# Patient Record
Sex: Male | Born: 1990 | Race: Black or African American | Hispanic: No | Marital: Single | Smoking: Current every day smoker
Health system: Southern US, Community
[De-identification: ages and names within clinical notes are randomized; demographics above are authoritative.]

## PROBLEM LIST (undated history)

## (undated) HISTORY — PX: ARTHROSCOPIC REPAIR ACL: SUR80

---
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
## 2010-02-16 ENCOUNTER — Emergency Department (HOSPITAL_COMMUNITY): Admission: EM | Admit: 2010-02-16 | Discharge: 2010-02-16 | Payer: Self-pay | Admitting: Emergency Medicine

## 2010-05-13 ENCOUNTER — Emergency Department (HOSPITAL_COMMUNITY): Admission: EM | Admit: 2010-05-13 | Discharge: 2010-05-13 | Payer: Self-pay | Admitting: Emergency Medicine

## 2012-06-22 ENCOUNTER — Emergency Department (HOSPITAL_COMMUNITY): Payer: BC Managed Care – PPO

## 2012-06-22 ENCOUNTER — Emergency Department (HOSPITAL_COMMUNITY)
Admission: EM | Admit: 2012-06-22 | Discharge: 2012-06-22 | Disposition: A | Discharge status: Home / Self Care | Payer: BC Managed Care – PPO | Attending: Emergency Medicine | Admitting: Emergency Medicine

## 2012-06-22 ENCOUNTER — Encounter (HOSPITAL_COMMUNITY): Payer: Self-pay

## 2012-06-22 DIAGNOSIS — X500XXA Overexertion from strenuous movement or load, initial encounter: Secondary | ICD-10-CM | POA: Insufficient documentation

## 2012-06-22 DIAGNOSIS — M25461 Effusion, right knee: Secondary | ICD-10-CM

## 2012-06-22 DIAGNOSIS — M25469 Effusion, unspecified knee: Secondary | ICD-10-CM | POA: Insufficient documentation

## 2012-06-22 DIAGNOSIS — M25569 Pain in unspecified knee: Secondary | ICD-10-CM | POA: Insufficient documentation

## 2012-06-22 DIAGNOSIS — Y9367 Activity, basketball: Secondary | ICD-10-CM | POA: Insufficient documentation

## 2012-06-22 MED ORDER — HYDROCODONE-ACETAMINOPHEN 5-325 MG PO TABS
1.0000 | ORAL_TABLET | Freq: Once | ORAL | Status: AC
  Administered 2012-06-22: 1 via ORAL
  Filled 2012-06-22: qty 1

## 2012-06-22 MED ORDER — IBUPROFEN 800 MG PO TABS
800.0000 mg | ORAL_TABLET | Freq: Once | ORAL | Status: AC
  Administered 2012-06-22: 800 mg via ORAL
  Filled 2012-06-22: qty 1

## 2012-06-22 MED ORDER — HYDROCODONE-ACETAMINOPHEN 5-325 MG PO TABS: 1.0000 | ORAL_TABLET | Freq: Four times a day (QID) | ORAL | Status: DC | PRN

## 2012-06-22 NOTE — ED Notes (Signed)
Complain of pain in right knee from injury

## 2012-06-22 NOTE — ED Provider Notes (Signed)
Medical screening examination/treatment/procedure(s) were performed by non-physician practitioner and as supervising physician I was immediately available for consultation/collaboration.   Carleene Cooper III, MD 06/22/12 [phone]

## 2012-06-22 NOTE — ED Notes (Signed)
Pt c/o pain and swelling to his right knee since yesterday. States that he felt something pop while playing basketball. Moderate amount swelling to left knee. Able to bend knee but c/o pain with movement. Pt has strong pedal pulses right foot. Pt alert and oriented x 3. Skin warm and dry. Color pink.

## 2012-06-22 NOTE — ED Provider Notes (Signed)
History     CSN: [id number]  Arrival date & time 06/22/12  1029   First MD Initiated Contact with Patient 06/22/12 1047      Chief Complaint  Patient presents with  . Knee Injury    (Consider location/radiation/quality/duration/timing/severity/associated sxs/prior treatment) HPI Comments: Playing basketball yest and felt knee "pop".  Has since developed pain in swelling in R knee.  No h/o knee problem.  Taking no meds.  No ortho MD.  The history is provided by the patient. No language interpreter was used.    History reviewed. No pertinent past medical history.  History reviewed. No pertinent past surgical history.  No family history on file.  History  Substance Use Topics  . Smoking status: Not on file  . Smokeless tobacco: Not on file  . Alcohol Use: No      Review of Systems  Constitutional: Negative for fever and chills.  Musculoskeletal: Positive for joint swelling.       Knee injury  All other systems reviewed and are negative.    Allergies  Codeine  Home Medications   Current Outpatient Rx  Name Route Sig Dispense Refill  . IBUPROFEN 200 MG PO TABS Oral Take 400 mg by mouth every 6 (six) hours as needed. For pain    . HYDROCODONE-ACETAMINOPHEN 5-325 MG PO TABS Oral Take 1 tablet by mouth every 6 (six) hours as needed for pain. 20 tablet 0    BP 121/67  Pulse 68  Temp 98.3 F (36.8 C) (Oral)  Resp 18  Ht 6\' 3"  (1.905 m)  Wt 183 lb (83.008 kg)  BMI 22.87 kg/m2  SpO2 100%  Physical Exam  Nursing note and vitals reviewed. Constitutional: He is oriented to person, place, and time. He appears well-developed and well-nourished.  HENT:  Head: Normocephalic and atraumatic.  Eyes: EOM are normal.  Neck: Normal range of motion.  Cardiovascular: Normal rate, regular rhythm, normal heart sounds and intact distal pulses.   Pulmonary/Chest: Effort normal and breath sounds normal. No respiratory distress.  Abdominal: Soft. He exhibits no distension.  There is no tenderness.  Musculoskeletal: He exhibits tenderness.       Right knee: He exhibits decreased range of motion and effusion. He exhibits no ecchymosis, no deformity, no laceration and no erythema. tenderness found.       Pain with valgus and varus stress.  Neurological: He is alert and oriented to person, place, and time.  Skin: Skin is warm and dry.  Psychiatric: He has a normal mood and affect. Judgment normal.    ED Course  Procedures (including critical care time)  Labs Reviewed - No data to display Dg Knee Complete 4 Views Right  06/22/2012  *RADIOLOGY REPORT*  Clinical Data: Basketball injury.  Medial knee pain.  RIGHT KNEE - COMPLETE 4+ VIEW  Comparison: None.  Findings: Large effusion.  No fracture.  Anatomic alignment.  Joint spaces are preserved.  IMPRESSION: Large knee effusion.  Original Report Authenticated By: Andreas Newport, M.D.     1. Knee effusion, right       MDM  No fx's.  Knee immobilizer, crutches, ice, ibuprofen 800 mg TID and rx-hydrocodone, 20 F/u with dr. Romeo Apple.        Evalina Field, PA 06/22/12 1140

## 2012-06-25 ENCOUNTER — Encounter (HOSPITAL_COMMUNITY): Payer: Self-pay | Admitting: Emergency Medicine

## 2012-06-25 ENCOUNTER — Emergency Department (HOSPITAL_COMMUNITY)
Admission: EM | Admit: 2012-06-25 | Discharge: 2012-06-25 | Disposition: A | Discharge status: Home / Self Care | Payer: BC Managed Care – PPO | Attending: Emergency Medicine | Admitting: Emergency Medicine

## 2012-06-25 ENCOUNTER — Emergency Department (HOSPITAL_COMMUNITY): Payer: BC Managed Care – PPO

## 2012-06-25 DIAGNOSIS — M25469 Effusion, unspecified knee: Secondary | ICD-10-CM | POA: Insufficient documentation

## 2012-06-25 DIAGNOSIS — Y9367 Activity, basketball: Secondary | ICD-10-CM | POA: Insufficient documentation

## 2012-06-25 DIAGNOSIS — S8990XA Unspecified injury of unspecified lower leg, initial encounter: Secondary | ICD-10-CM | POA: Insufficient documentation

## 2012-06-25 DIAGNOSIS — R296 Repeated falls: Secondary | ICD-10-CM | POA: Insufficient documentation

## 2012-06-25 DIAGNOSIS — Y9239 Other specified sports and athletic area as the place of occurrence of the external cause: Secondary | ICD-10-CM | POA: Insufficient documentation

## 2012-06-25 DIAGNOSIS — S83509A Sprain of unspecified cruciate ligament of unspecified knee, initial encounter: Secondary | ICD-10-CM | POA: Insufficient documentation

## 2012-06-25 DIAGNOSIS — R29898 Other symptoms and signs involving the musculoskeletal system: Secondary | ICD-10-CM | POA: Insufficient documentation

## 2012-06-25 DIAGNOSIS — S83519A Sprain of anterior cruciate ligament of unspecified knee, initial encounter: Secondary | ICD-10-CM

## 2012-06-25 MED ORDER — NAPROXEN 500 MG PO TABS: 500.0000 mg | ORAL_TABLET | Freq: Two times a day (BID) | ORAL | Status: AC

## 2012-06-25 NOTE — ED Notes (Signed)
States he was in the shower this morning and felt something pop in his right knee.  States he injured his right knee initially about 4 days ago and was seen here in the er.

## 2012-06-25 NOTE — ED Provider Notes (Addendum)
History     CSN: [id number]  Arrival date & time 06/25/12  7829   First MD Initiated Contact with Patient 06/25/12 [phone]      Chief Complaint  Patient presents with  . Knee Injury    (Consider location/radiation/quality/duration/timing/severity/associated sxs/prior treatment) The history is provided by the patient.  patient seen in the emergency department on July 7 following a knee injury that occurred the day before. Patient had x-rays on the seventh which showed a large effusion but no bony abnormalities. Patient continues to have problems with the right knee. Today while getting in the shower and another popping sensation the knee feels like it was going to give out and had increased pain. Patient was treated with knee immobilizer and crutches and referral to orthopedics as well as hydrocodone. Patient did not make appointment orthopedic she wasn't clear in his mind that it was necessary. Patient still has a significant swelling to the right knee no prior injury to the knee. Pain at times is 10 out of 10 currently now is about a 5/10. No sensory changes to his foot on that side no other injuries. Patient is not taking anti-inflammatories. Pain is mostly on the medial side of the right knee does not radiate. Pain is described as sharp and then a period  Original injury occurred while playing basketball the knee just gave out the patient went down.  History reviewed. No pertinent past medical history.  History reviewed. No pertinent past surgical history.  No family history on file.  History  Substance Use Topics  . Smoking status: Not on file  . Smokeless tobacco: Not on file  . Alcohol Use: No      Review of Systems  Constitutional: Negative for fever and chills.  HENT: Negative for congestion and neck pain.   Eyes: Negative for redness.  Respiratory: Negative for shortness of breath.   Cardiovascular: Negative for chest pain.  Genitourinary: Negative for dysuria.    Musculoskeletal: Negative for back pain.  Skin: Negative for rash.  Neurological: Positive for weakness. Negative for numbness and headaches.  Hematological: Does not bruise/bleed easily.    Allergies  Codeine  Home Medications   Current Outpatient Rx  Name Route Sig Dispense Refill  . HYDROCODONE-ACETAMINOPHEN 5-325 MG PO TABS Oral Take 1 tablet by mouth every 6 (six) hours as needed. For pain    . IBUPROFEN 200 MG PO TABS Oral Take 400 mg by mouth every 6 (six) hours as needed. For pain    . NAPROXEN 500 MG PO TABS Oral Take 1 tablet (500 mg total) by mouth 2 (two) times daily. 14 tablet 0    BP 125/70  Pulse 60  Temp 97.9 F (36.6 C) (Oral)  Resp 18  Ht 6\' 3"  (1.905 m)  Wt 183 lb (83.008 kg)  BMI 22.87 kg/m2  SpO2 97%  Physical Exam  Nursing note and vitals reviewed. Constitutional: He is oriented to person, place, and time. He appears well-developed and well-nourished.  HENT:  Head: Normocephalic and atraumatic.  Eyes: Conjunctivae and EOM are normal. Pupils are equal, round, and reactive to light.  Neck: Normal range of motion. Neck supple.  Cardiovascular: Normal rate, regular rhythm, normal heart sounds and intact distal pulses.   No murmur heard. Pulmonary/Chest: Effort normal and breath sounds normal.  Abdominal: Soft. Bowel sounds are normal.  Musculoskeletal: Normal range of motion. He exhibits tenderness.       Normal except for right knee with moderate to large effusion joint  line tenderness on the medial side patella is not dislocated. Pain with range of motion. Dorsalis pedis pulse distally in the right foot is 2+ sensation is intact.  Neurological: He is alert and oriented to person, place, and time. No cranial nerve deficit. He exhibits normal muscle tone. Coordination normal.  Skin: Skin is warm. No rash noted. No erythema.    ED Course  Procedures (including critical care time)  Labs Reviewed - No data to display Mr Knee Right Wo  Contrast  06/25/2012  *RADIOLOGY REPORT*  Clinical Data:  Recent knee injury with swelling, pain and instability.  No previous relevant surgery.  MRI OF THE RIGHT KNEE WITHOUT CONTRAST  Technique:  Multiplanar, multisequence MR imaging of the right knee was performed.  No intravenous contrast was administered.  Comparison:  Radiographs 06/22/2012.  FINDINGS: MENISCI Medial:  There is a large bucket-handle tear of the medial meniscus with a large centrally displaced meniscal fragment, identified at all three planes. Lateral:  The lateral meniscus is intact.  LIGAMENTS Cruciates:  No intact anterior cruciate ligament fibers are identified.  Posterior cruciate ligament is intact. Collaterals:  Intact.  There is minimal edema superficial to the medial collateral ligament.  CARTILAGE Patellofemoral:  There is minimal chondral irregularity over the inferior patellar apex.  The trochlear cartilage is intact. Medial:  Preserved. Lateral:  Preserved.  Joint:  There is a large knee joint effusion.  This joint fluid demonstrates intermediate T1 signal most consistent with hemarthrosis.  There is mild dependent debris.  There is edema within Hoffa's fat. Popliteal Fossa:  No significant Baker's cyst or capsular extravasation. Extensor Mechanism:  Intact. Bones: There are mild contusions involving the lateral femoral condyle anteriorly and the posterior aspects of both tibial plateaus.  No cortical impaction is evident.  IMPRESSION:  1.  Large bucket-handle tear of the medial meniscus. 2.  Complete tear of the anterior cruciate ligament, likely acute or subacute given the associated osseous injuries. 3.  Possible grade 1 MCL sprain. 4.  Intact lateral meniscus. 5.  Large hemarthrosis.  Original Report Authenticated By: Gerrianne Scale, M.D.     1. Knee injury   2. Anterior cruciate ligament complete tear       MDM  Patient seen July 7 in the [facility] area had x-rays of his knee they have been reviewed no bony  abnormalities large effusion present. We'll attempt to get an MRI today to further clarify which most likely is an internal injury to the knee. Cruciate ligament injuries possibility since it happened while playing basketball.  MRI [place] that there is an anterior cruciate ligament tear in its complete also does explained the medial joint line tenderness based on the medial meniscus buckle handle tear. Patient understands that he has to followup with orthopedics he will continue use his knee immobilizer and crutches given prescription for Naprosyn. he has a prescription for hydrocodone.       Shelda Jakes, MD 06/25/12 1610  Shelda Jakes, MD 06/26/12 1212

## 2012-06-25 NOTE — ED Notes (Signed)
Right knee appears slightly swollen from baseline per patient.  States he is unable to bear weight at all.  States that he injured his knee about 4 days ago.  States he was in the shower this am and felt a pop in his right knee.

## 2012-07-01 ENCOUNTER — Encounter: Payer: Self-pay | Admitting: Orthopedic Surgery

## 2012-07-01 ENCOUNTER — Ambulatory Visit: Payer: BC Managed Care – PPO | Admitting: Orthopedic Surgery

## 2014-06-05 ENCOUNTER — Emergency Department (HOSPITAL_COMMUNITY): Payer: 59

## 2014-06-05 ENCOUNTER — Emergency Department (HOSPITAL_COMMUNITY)
Admission: EM | Admit: 2014-06-05 | Discharge: 2014-06-05 | Disposition: A | Discharge status: Home / Self Care | Payer: 59 | Attending: Emergency Medicine | Admitting: Emergency Medicine

## 2014-06-05 ENCOUNTER — Encounter (HOSPITAL_COMMUNITY): Payer: Self-pay | Admitting: Emergency Medicine

## 2014-06-05 DIAGNOSIS — M545 Low back pain, unspecified: Secondary | ICD-10-CM | POA: Insufficient documentation

## 2014-06-05 DIAGNOSIS — F17200 Nicotine dependence, unspecified, uncomplicated: Secondary | ICD-10-CM | POA: Insufficient documentation

## 2014-06-05 DIAGNOSIS — R110 Nausea: Secondary | ICD-10-CM | POA: Insufficient documentation

## 2014-06-05 LAB — URINALYSIS, ROUTINE W REFLEX MICROSCOPIC
BILIRUBIN URINE: NEGATIVE
Glucose, UA: NEGATIVE mg/dL
Hgb urine dipstick: NEGATIVE
LEUKOCYTES UA: NEGATIVE
NITRITE: NEGATIVE
PH: 6 (ref 5.0–8.0)
Protein, ur: NEGATIVE mg/dL
UROBILINOGEN UA: 0.2 mg/dL (ref 0.0–1.0)

## 2014-06-05 MED ORDER — CYCLOBENZAPRINE HCL 10 MG PO TABS: 10.0000 mg | ORAL_TABLET | Freq: Three times a day (TID) | ORAL | Status: AC | PRN

## 2014-06-05 MED ORDER — OXYCODONE-ACETAMINOPHEN 5-325 MG PO TABS
1.0000 | ORAL_TABLET | Freq: Once | ORAL | Status: AC
  Administered 2014-06-05: 1 via ORAL
  Filled 2014-06-05: qty 1

## 2014-06-05 MED ORDER — NAPROXEN 500 MG PO TABS: 500.0000 mg | ORAL_TABLET | Freq: Two times a day (BID) | ORAL | Status: AC

## 2014-06-05 NOTE — Discharge Instructions (Signed)
Follow up if not improving

## 2014-06-05 NOTE — ED Provider Notes (Signed)
CSN: [id number]     Arrival date & time 06/05/14  1556 History   First MD Initiated Contact with Patient 06/05/14 1622    This chart was scribed for Benny LennertJoseph L Zammit, MD by Marica OtterNusrat Rahman, ED Scribe. This patient was seen in room APA19/[facility]19 and the patient's care was started at 4:39 PM.  PCP: No primary Tennis Mckinnon on file.  Chief Complaint  Patient presents with  . Flank Pain   Patient is a 23 y.o. male presenting with flank pain. The history is provided by the patient. No language interpreter was used.  Flank Pain This is a recurrent problem. The current episode started 2 days ago. The problem occurs constantly. The problem has been gradually worsening. Pertinent negatives include no chest pain, no abdominal pain and no headaches. The symptoms are aggravated by twisting (going up steps worsens pain).   HPI Comments: Nicholas Wiley is a 23 y.o. male who presents to the Emergency Department complaining of constant right flank pain with associated nausea onset 2 days ago. Pt describes the pain as an aching pain and rates it a 7 out of 10 in the pain scale. Pt reports the pain is aggravated with certain movements such as going up steps and twisting. Pt also reports he had abd pain 3 days ago that has now resolved. Pt further reports a Hx of kidney stones and states that his current pain is consistent with the pain he experienced when he had kidney stones.   Pt is a current everyday smoker. Pt denies any allergies to meds except codeine.   History reviewed. No pertinent past medical history. Past Surgical History  Procedure Laterality Date  . Arthroscopic repair acl     History reviewed. No pertinent family history. History  Substance Use Topics  . Smoking status: Current Every Day Smoker    Types: Cigarettes  . Smokeless tobacco: Not on file  . Alcohol Use: No    Review of Systems  Constitutional: Negative for appetite change and fatigue.  HENT: Negative for congestion, ear discharge and  sinus pressure.   Eyes: Negative for discharge.  Respiratory: Negative for cough.   Cardiovascular: Negative for chest pain.  Gastrointestinal: Positive for nausea. Negative for abdominal pain and diarrhea.  Genitourinary: Positive for flank pain (right flnak pain). Negative for frequency and hematuria.  Musculoskeletal: Negative for back pain.  Skin: Negative for rash.  Neurological: Negative for seizures and headaches.  Psychiatric/Behavioral: Negative for hallucinations.   Allergies  Codeine  Home Medications   Prior to Admission medications   Medication Sig Start Date End Date Taking? Authorizing Chandra Asher  ibuprofen (ADVIL,MOTRIN) 200 MG tablet Take 400 mg by mouth every 6 (six) hours as needed. For pain   Yes Historical Monet North, MD   Triage Vitals: BP 127/65  Pulse 79  Temp(Src) 97.8 F (36.6 C) (Oral)  Resp 20  Ht 6\' 3"  (1.905 m)  Wt 194 lb (87.998 kg)  BMI 24.25 kg/m2  SpO2 99% Physical Exam  Constitutional: He is oriented to person, place, and time. He appears well-developed.  HENT:  Head: Normocephalic.  Eyes: Conjunctivae and EOM are normal. No scleral icterus.  Neck: Neck supple. No thyromegaly present.  Cardiovascular: Normal rate and regular rhythm.  Exam reveals no gallop and no friction rub.   No murmur heard. Pulmonary/Chest: No stridor. He has no wheezes. He has no rales. He exhibits no tenderness.  Abdominal: He exhibits no distension. There is no tenderness. There is no rebound.  Musculoskeletal: Normal  range of motion. He exhibits no edema.  Moderate right flank tenderness   Lymphadenopathy:    He has no cervical adenopathy.  Neurological: He is oriented to person, place, and time. He exhibits normal muscle tone. Coordination normal.  Skin: No rash noted. No erythema.  Psychiatric: He has a normal mood and affect. His behavior is normal.    ED Course  Procedures (including critical care time) DIAGNOSTIC STUDIES: Oxygen Saturation is 99% on RA,  normal by my interpretation.    COORDINATION OF CARE: 4:41 PM-Discussed treatment plan which includes UA and meds with pt at bedside. Patient verbalizes understanding and agrees with treatment plan.   Labs Review Labs Reviewed  URINALYSIS, ROUTINE W REFLEX MICROSCOPIC    Imaging Review No results found.   EKG Interpretation None      MDM   Final diagnoses:  None    The chart was scribed for me under my direct supervision.  I personally performed the history, physical, and medical decision making and all procedures in the evaluation of this patient.Benny Lennert.   Joseph L Zammit, MD 06/05/14 Barry Brunner1935

## 2014-06-05 NOTE — ED Notes (Signed)
Patient with no complaints at this time. Respirations even and unlabored. Skin warm/dry. Discharge instructions reviewed with patient at this time. Patient given opportunity to voice concerns/ask questions. Patient discharged at this time and left Emergency Department with steady gait.   

## 2014-06-05 NOTE — ED Notes (Signed)
Right flank pain started Thursday morning, c/o nausea Thursday, reports history of kidney stones

## 2016-01-03 IMAGING — CT CT ABD-PELV W/O CM
2 of 3 series · 17 of 46 positions shown, 19 images · non-contrast
Comparison: CT of the abdomen and pelvis 01/13/2006.

CLINICAL DATA: Severe right-sided flank pain for the past few days.
History of kidney stones.

EXAM:
CT ABDOMEN AND PELVIS WITHOUT CONTRAST
TECHNIQUE: Multidetector CT imaging of the abdomen and pelvis was performed
following the standard protocol without IV contrast.

[Series 2: standard/full over (age)lbs 5.0 · axial · 0.66mm/px · z∈[-440,-15]mm · 14 of 99 slices shown, 16 images]
[im 7/99  soft-tissue]
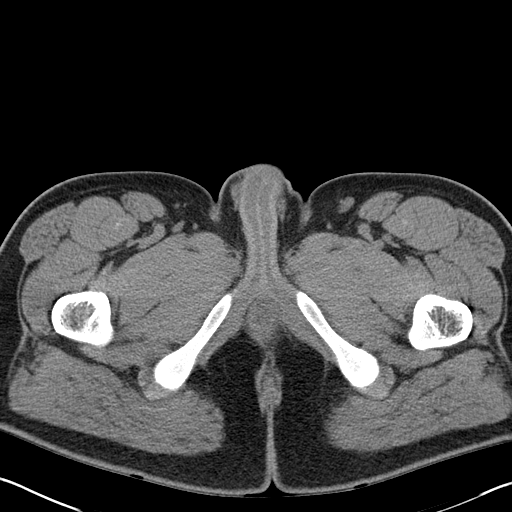
[im 7/99  bone]
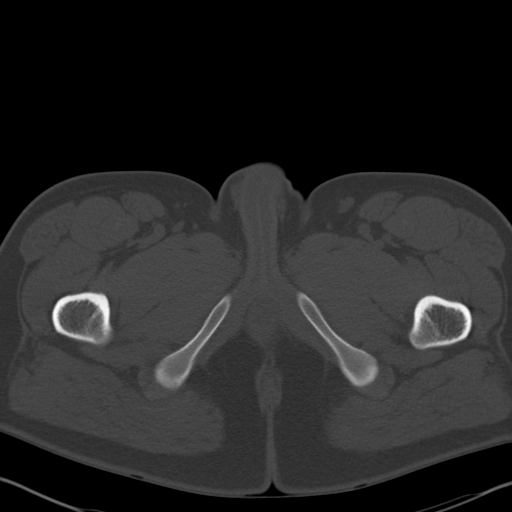
[im 13/99  soft-tissue]
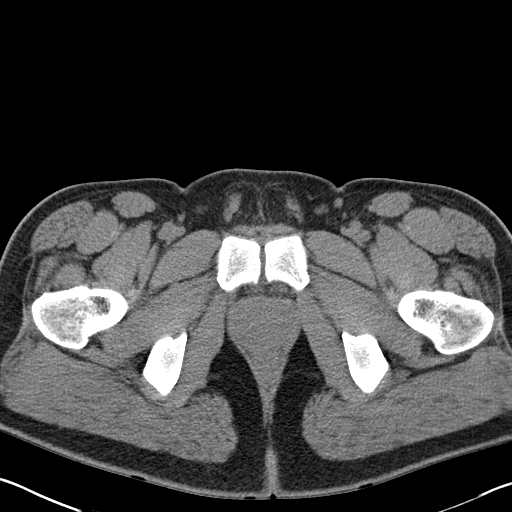
[im 19/99  soft-tissue]
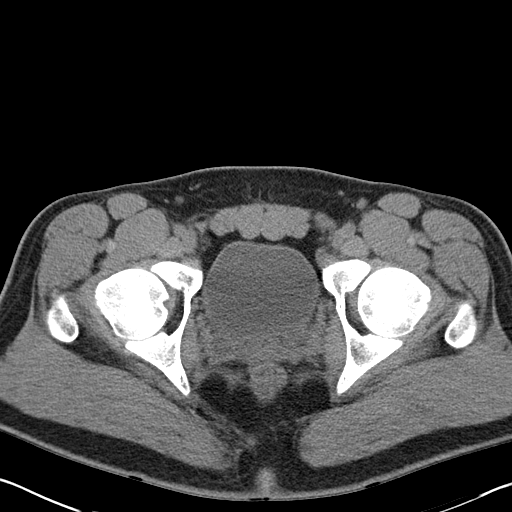
[im 26/99  soft-tissue]
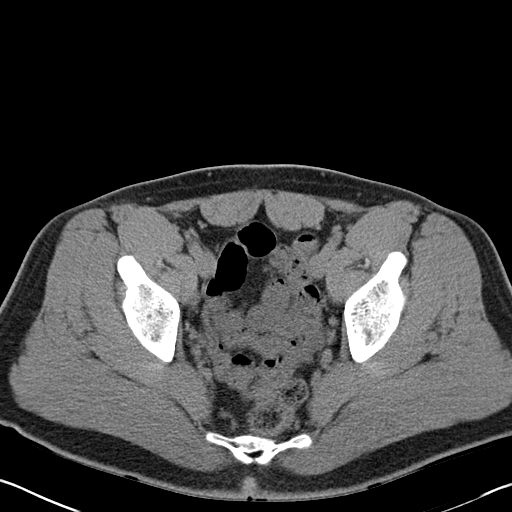
[im 32/99  soft-tissue]
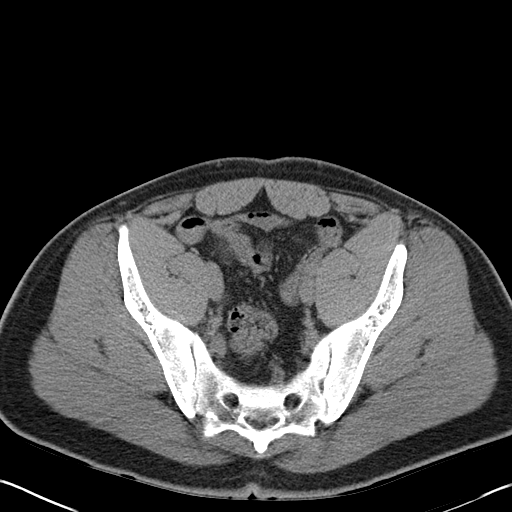
[im 38/99  soft-tissue]
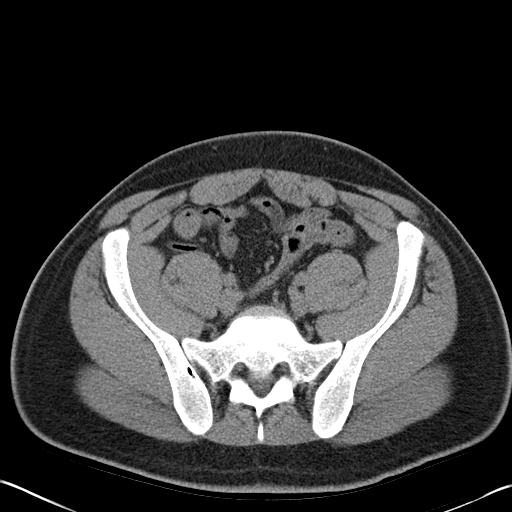
[im 45/99  soft-tissue]
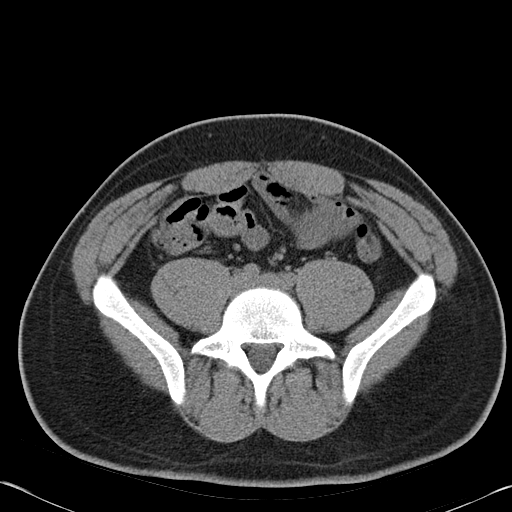
[im 54/99  soft-tissue]
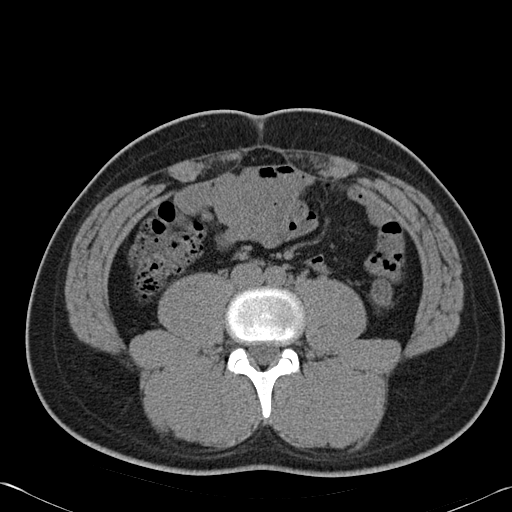
[im 61/99  soft-tissue]
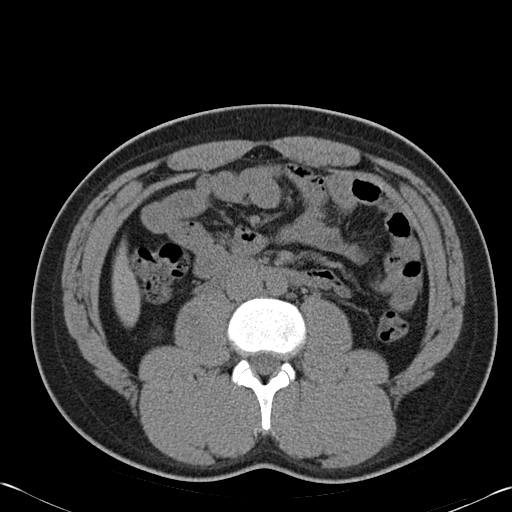
[im 61/99  bone]
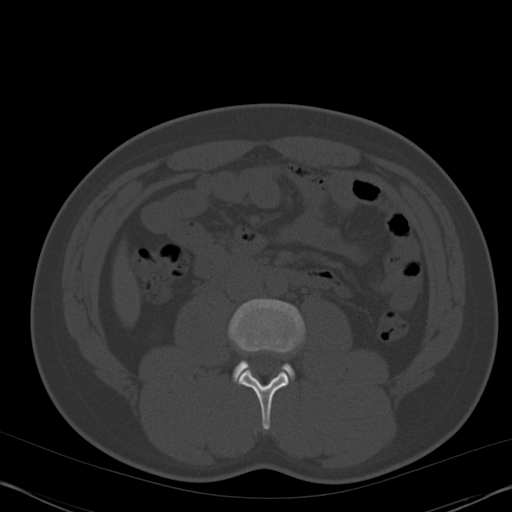
[im 67/99  soft-tissue]
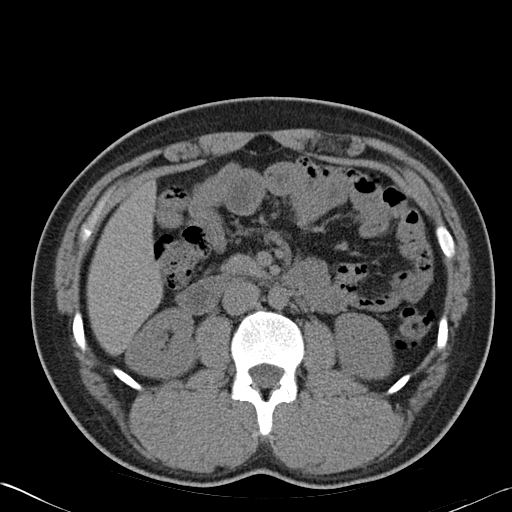
[im 73/99  soft-tissue]
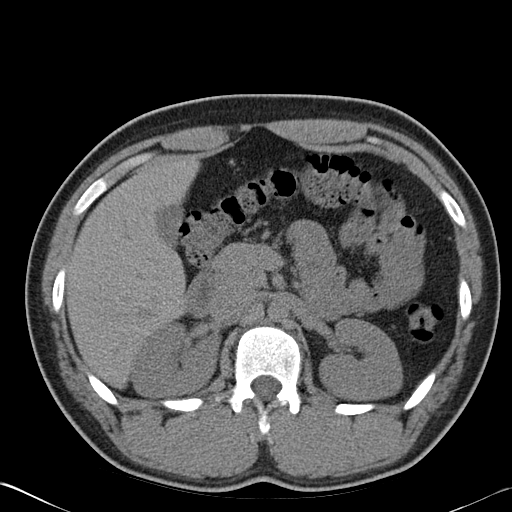
[im 80/99  soft-tissue]
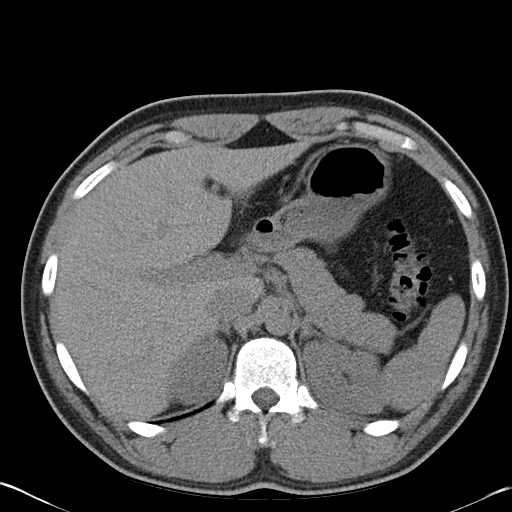
[im 86/99  soft-tissue]
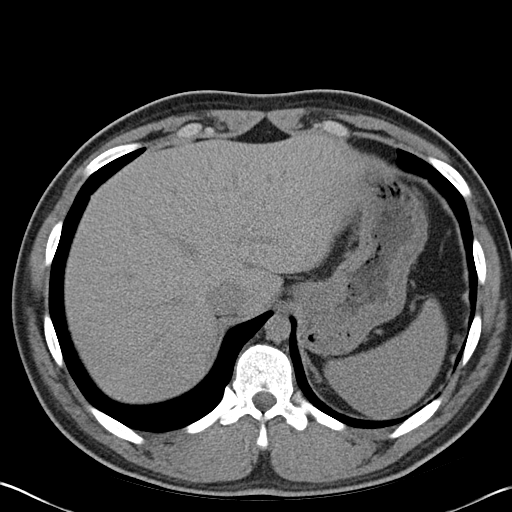
[im 92/99  soft-tissue]
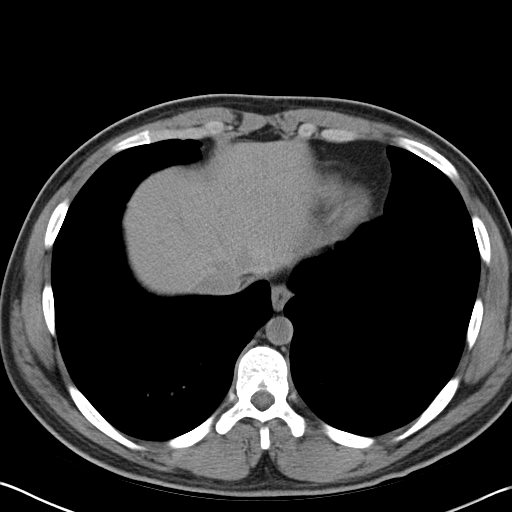

[Series 4: mpr coronal · coronal · 0.96mm/px · 3 of 89 slices shown]
[im 30/89  soft-tissue]
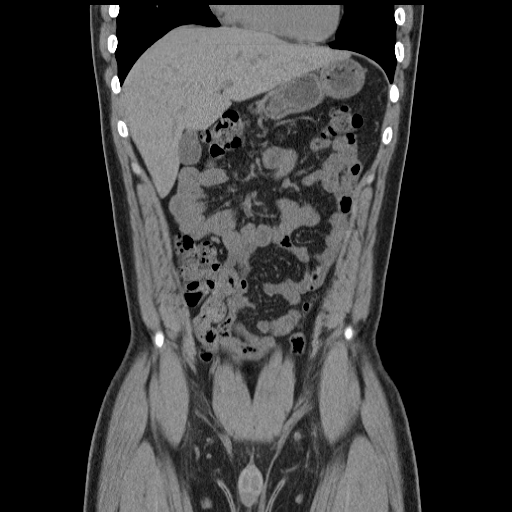
[im 40/89  soft-tissue]
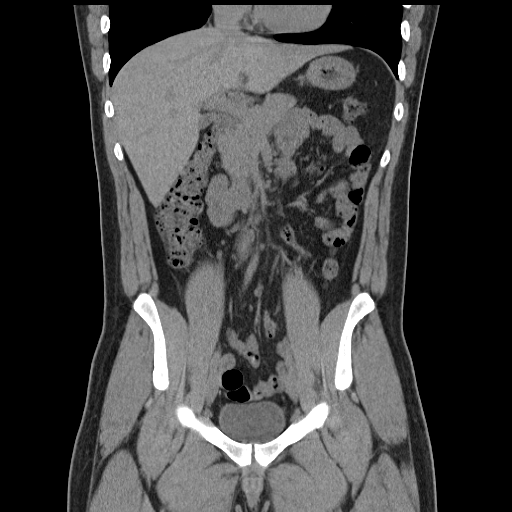
[im 49/89  soft-tissue]
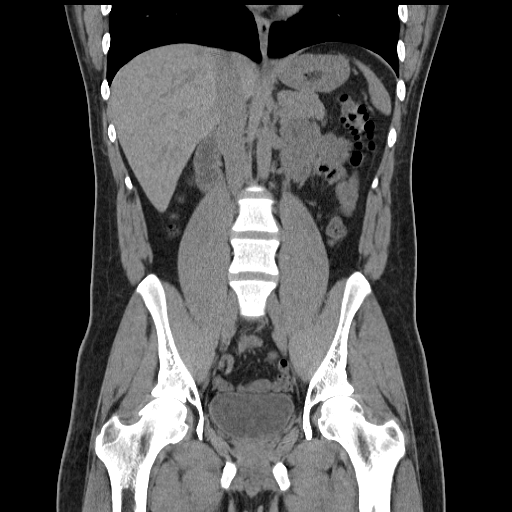

[17 of 46 positions shown; findings below may reference images not displayed]

FINDINGS: Lung Bases: Unremarkable.

Abdomen/Pelvis: There are no abnormal calcifications within the
collecting system of either kidney, along the course of either
ureter, or within the lumen of the urinary bladder. No
hydroureteronephrosis or perinephric stranding to suggest urinary
tract obstruction at this time. The unenhanced appearance of the
kidneys is unremarkable bilaterally.

The unenhanced appearance of the liver, gallbladder, pancreas,
spleen and bilateral adrenal glands is unremarkable. Normal
appendix. No significant volume of ascites. No pneumoperitoneum. No
pathologic distention of small bowel. No lymphadenopathy identified
within the abdomen or pelvis on today's non contrast CT examination.
Prostate gland and urinary bladder are unremarkable in appearance.

Musculoskeletal: There are no aggressive appearing lytic or blastic
lesions noted in the visualized portions of the skeleton.
IMPRESSION: 1. No acute findings in the abdomen or pelvis to account for the
patient's symptoms.
2. Specifically, no abnormal urinary tract calculi or findings of
urinary tract obstruction at this time.
3. Normal appendix.

## 2020-03-08 ENCOUNTER — Ambulatory Visit: Payer: Self-pay | Attending: Internal Medicine

## 2020-03-08 ENCOUNTER — Other Ambulatory Visit: Payer: Self-pay

## 2020-03-08 DIAGNOSIS — Z20822 Contact with and (suspected) exposure to covid-19: Secondary | ICD-10-CM | POA: Insufficient documentation

## 2020-03-09 LAB — NOVEL CORONAVIRUS, NAA: SARS-CoV-2, NAA: NOT DETECTED

## 2020-03-09 LAB — SARS-COV-2, NAA 2 DAY TAT

## 2020-07-28 ENCOUNTER — Other Ambulatory Visit: Payer: Self-pay

## 2020-07-28 DIAGNOSIS — Z20822 Contact with and (suspected) exposure to covid-19: Secondary | ICD-10-CM

## 2020-07-29 LAB — NOVEL CORONAVIRUS, NAA: SARS-CoV-2, NAA: DETECTED — AB

## 2020-07-29 LAB — SARS-COV-2, NAA 2 DAY TAT

## 2020-08-04 ENCOUNTER — Other Ambulatory Visit: Payer: Self-pay

## 2020-08-05 ENCOUNTER — Other Ambulatory Visit: Payer: Self-pay

## 2020-08-05 DIAGNOSIS — Z20822 Contact with and (suspected) exposure to covid-19: Secondary | ICD-10-CM

## 2020-08-06 LAB — SARS-COV-2, NAA 2 DAY TAT

## 2020-08-06 LAB — NOVEL CORONAVIRUS, NAA: SARS-CoV-2, NAA: NOT DETECTED

## 2020-08-06 LAB — SPECIMEN STATUS REPORT

## 2020-08-11 ENCOUNTER — Other Ambulatory Visit: Payer: Self-pay

## 2024-05-01 ENCOUNTER — Other Ambulatory Visit: Payer: Self-pay

## 2024-05-01 ENCOUNTER — Emergency Department (HOSPITAL_COMMUNITY): Payer: Self-pay

## 2024-05-01 ENCOUNTER — Emergency Department (HOSPITAL_COMMUNITY)
Admission: EM | Admit: 2024-05-01 | Discharge: 2024-05-01 | Discharge status: Against Medical Advice | Payer: Self-pay | Attending: Emergency Medicine | Admitting: Emergency Medicine

## 2024-05-01 ENCOUNTER — Encounter (HOSPITAL_COMMUNITY): Payer: Self-pay

## 2024-05-01 DIAGNOSIS — Z5321 Procedure and treatment not carried out due to patient leaving prior to being seen by health care provider: Secondary | ICD-10-CM | POA: Diagnosis not present

## 2024-05-01 DIAGNOSIS — M25512 Pain in left shoulder: Secondary | ICD-10-CM | POA: Diagnosis present

## 2024-05-01 DIAGNOSIS — M545 Low back pain, unspecified: Secondary | ICD-10-CM | POA: Diagnosis not present

## 2024-05-01 DIAGNOSIS — Y92410 Unspecified street and highway as the place of occurrence of the external cause: Secondary | ICD-10-CM | POA: Insufficient documentation
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
